# Patient Record
Sex: Female | Born: 1960 | Race: Black or African American | Hispanic: No | Marital: Married | Smoking: Never smoker
Health system: Southern US, Community
[De-identification: ages and names within clinical notes are randomized; demographics above are authoritative.]

## PROBLEM LIST (undated history)

## (undated) DIAGNOSIS — I10 Essential (primary) hypertension: Secondary | ICD-10-CM

## (undated) DIAGNOSIS — N189 Chronic kidney disease, unspecified: Secondary | ICD-10-CM

## (undated) HISTORY — PX: TUBAL LIGATION: SHX77

## (undated) HISTORY — PX: KIDNEY TRANSPLANT: SHX239

---

## 1998-05-03 ENCOUNTER — Other Ambulatory Visit: Admission: RE | Admit: 1998-05-03 | Discharge: 1998-05-03 | Payer: Self-pay | Admitting: Obstetrics and Gynecology

## 1999-10-15 ENCOUNTER — Encounter: Payer: Self-pay | Admitting: Vascular Surgery

## 1999-10-15 ENCOUNTER — Ambulatory Visit (HOSPITAL_COMMUNITY): Admission: RE | Admit: 1999-10-15 | Discharge: 1999-10-15 | Payer: Self-pay | Admitting: Vascular Surgery

## 2000-01-31 ENCOUNTER — Emergency Department (HOSPITAL_COMMUNITY): Admission: EM | Admit: 2000-01-31 | Discharge: 2000-01-31 | Payer: Self-pay | Admitting: Emergency Medicine

## 2000-01-31 ENCOUNTER — Encounter: Payer: Self-pay | Admitting: Emergency Medicine

## 2000-05-10 ENCOUNTER — Ambulatory Visit (HOSPITAL_COMMUNITY): Admission: RE | Admit: 2000-05-10 | Discharge: 2000-05-10 | Payer: Self-pay | Admitting: Vascular Surgery

## 2000-05-10 ENCOUNTER — Encounter: Payer: Self-pay | Admitting: Vascular Surgery

## 2000-07-05 ENCOUNTER — Ambulatory Visit: Admission: RE | Admit: 2000-07-05 | Discharge: 2000-07-05 | Payer: Self-pay | Admitting: Vascular Surgery

## 2000-07-05 ENCOUNTER — Encounter: Payer: Self-pay | Admitting: Vascular Surgery

## 2000-11-09 ENCOUNTER — Other Ambulatory Visit: Admission: RE | Admit: 2000-11-09 | Discharge: 2000-11-09 | Payer: Self-pay | Admitting: Obstetrics and Gynecology

## 2001-03-13 ENCOUNTER — Inpatient Hospital Stay (HOSPITAL_COMMUNITY): Admission: AD | Admit: 2001-03-13 | Discharge: 2001-03-13 | Payer: Self-pay | Admitting: Obstetrics & Gynecology

## 2001-06-09 ENCOUNTER — Inpatient Hospital Stay (HOSPITAL_COMMUNITY): Admission: AD | Admit: 2001-06-09 | Discharge: 2001-06-11 | Payer: Self-pay | Admitting: Obstetrics and Gynecology

## 2001-07-23 ENCOUNTER — Ambulatory Visit (HOSPITAL_COMMUNITY): Admission: RE | Admit: 2001-07-23 | Discharge: 2001-07-23 | Payer: Self-pay | Admitting: Obstetrics and Gynecology

## 2001-10-08 ENCOUNTER — Encounter: Payer: Self-pay | Admitting: Emergency Medicine

## 2001-10-08 ENCOUNTER — Emergency Department (HOSPITAL_COMMUNITY): Admission: EM | Admit: 2001-10-08 | Discharge: 2001-10-08 | Payer: Self-pay | Admitting: Emergency Medicine

## 2001-10-15 ENCOUNTER — Encounter: Payer: Self-pay | Admitting: Vascular Surgery

## 2001-10-15 ENCOUNTER — Ambulatory Visit (HOSPITAL_COMMUNITY): Admission: RE | Admit: 2001-10-15 | Discharge: 2001-10-15 | Payer: Self-pay | Admitting: Vascular Surgery

## 2001-12-10 ENCOUNTER — Ambulatory Visit (HOSPITAL_COMMUNITY): Admission: RE | Admit: 2001-12-10 | Discharge: 2001-12-10 | Payer: Self-pay | Admitting: Vascular Surgery

## 2002-03-23 ENCOUNTER — Inpatient Hospital Stay (HOSPITAL_COMMUNITY): Admission: RE | Admit: 2002-03-23 | Discharge: 2002-03-24 | Payer: Self-pay | Admitting: General Surgery

## 2003-05-13 ENCOUNTER — Emergency Department (HOSPITAL_COMMUNITY): Admission: EM | Admit: 2003-05-13 | Discharge: 2003-05-13 | Payer: Self-pay | Admitting: Emergency Medicine

## 2009-08-20 ENCOUNTER — Inpatient Hospital Stay (HOSPITAL_COMMUNITY): Admission: AD | Admit: 2009-08-20 | Discharge: 2009-08-20 | Payer: Self-pay | Admitting: Obstetrics & Gynecology

## 2010-12-17 LAB — URINALYSIS, ROUTINE W REFLEX MICROSCOPIC
Bilirubin Urine: NEGATIVE
Ketones, ur: NEGATIVE mg/dL
Nitrite: NEGATIVE
Protein, ur: NEGATIVE mg/dL
pH: 6.5 (ref 5.0–8.0)

## 2010-12-17 LAB — CBC
HCT: 36.5 % (ref 36.0–46.0)
MCV: 94.2 fL (ref 78.0–100.0)
Platelets: 218 10*3/uL (ref 150–400)
RBC: 3.88 MIL/uL (ref 3.87–5.11)
WBC: 7.1 10*3/uL (ref 4.0–10.5)

## 2010-12-17 LAB — WET PREP, GENITAL: Yeast Wet Prep HPF POC: NONE SEEN

## 2010-12-17 LAB — HCG, SERUM, QUALITATIVE: Preg, Serum: NEGATIVE

## 2010-12-17 LAB — POCT PREGNANCY, URINE: Preg Test, Ur: NEGATIVE

## 2011-01-31 NOTE — Op Note (Signed)
[place]. [facility]  Patient:    Lisa Merritt, Lisa Merritt Visit Number: [id number] MRN: [id number]          Service Type: DSU Location: [facility] [id number] Attending Physician:  Bennye Alm Dictated by:   Di Kindle Edilia Bo, M.D. Proc. 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  PROCEDURE:  Revision of left upper arm AV graft with replacement of venous half of graft with interposition 7 mm graft.  SURGEON:  Di Kindle. Edilia Bo, M.D.  ASSISTANT:  Loura Pardon, P.A.-C.  ANESTHESIA:  Local with sedation.  TECHNIQUE:  The patient was brought to the operating room, sedated by anesthesia. The left upper extremity was prepped and draped in the usual sterile fashion. After the skin was infiltrated with 1% lidocaine, an incision was made at the distal loop of the graft along the venous limb of the graft and also up on the higher aspect of the venous limb of the graft. Of note, the venous limb of the graft was along the ulnar aspect of the upper arm. These segments of graft were dissected free. A new segment of 7 mm graft was tunneled between the two incisions lateral to this large aneurysm. The patient was then heparinized with 5000 units of IV heparin. The graft was clamped proximally and distally and the graft was divided at both ends. A segment was excised from each end. The new segment of graft was then sewn end-to-end at both ends using continuous ______ suture. At the completion, there was an excellent thrill in the graft. The aneurysm was decompressed. Hemostasis was obtained in the wounds and the wounds were closed with a deep layer of 3-0 Vicryl and the skin closed with 4-0 Vicryl. A  sterile dressing was applied and the patient tolerated the procedure well and was transferred to the recovery room in satisfactory condition. All needle and sponge counts were correct. Dictated by:   Di Kindle Edilia Bo, M.D. Attending Physician:  Bennye Alm DD:  12/10/01 TD:  12/11/01 Job: [phone] [id number]

## 2011-01-31 NOTE — Op Note (Signed)
[place]. [facility]  Patient:    Lisa Merritt, Lisa Merritt Visit Number: [id number] MRN: [id number]          Service Type: DSU Location: [facility] [id number] Attending Physician:  Bennye Alm Dictated by:   Di Kindle Edilia Bo, M.D. Proc. Date: 10/15/01 Admit Date:  10/15/2001 Discharge Date: 10/15/2001   CC:         Di Kindle. Edilia Bo, M.D.   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  POSTOPERATIVE DIAGNOSIS:  Aneurysmal left upper arm loupe arteriovenous graft.  OPERATION:  Revision of venous half of upper arm loupe AV graft.  SURGEON:  Di Kindle. Edilia Bo, M.D.  ASSISTANTTollie Pizza. Collins, P.A.-C.  ANESTHESIA:  Local with sedation.  DESCRIPTION OF PROCEDURE:  The patient was taken to the operating room and sedated by anesthesia.  The entire left upper extremity was prepped and draped in the usual sterile fashion.  After the skin was infiltrated with 1% lidocaine, an oblique incision was made over the distal loupe of the graft and the graft here was dissected free.  A separate oblique incision was made over the proximal part of the graft, along the venous side of the graft, which was along the radial aspect of the upper arm.  The graft here was dissected free.  A new tunnel was created lateral to the old graft, around the large aneurysm using a curved tunneler. The patient was then heparinized.  The graft was then clamped at both ends and divided and the new segment of graft which was a 7 mm graft was sewn end-to-end using continuous CV6 PTFE suture.  At completion, there was a good thrill in the graft.  Hemostasis was obtained and the wounds were closed with a deep layer of 3-0 Vicryl and the skin closed with 4-0 Vicryl.  Of note, the patient had an aneurysm along the venous limb of the graft; however, as the patient did not want a catheter, we did not replace the  entire graft and therefore we left the venous half of the graft to be cannulated and to be revised at a future time once the new segment of graft can be cannulated.  Sterile dressing was applied.  The patient tolerated the procedure well.  The patient was transferred to the recovery room in satisfactory condition.  All need and sponge counts were correct. Dictated by:   Di Kindle Edilia Bo, M.D. Attending Physician:  Bennye Alm DD:  10/15/01 TD:  10/17/01 Job: [phone]

## 2011-01-31 NOTE — Op Note (Signed)
[place] [facility]  Patient:    Lisa Merritt, Lisa Merritt Visit Number: [id number] MRN: [id number]          Service Type: OBS Location: 910A [phone] Attending Physician:  Osborn Coho Proc. Date: 06/10/01 Admit Date:  06/09/2001 Discharge Date: 06/11/2001   CC:         CVTS Office   Operative Report  PREOPERATIVE DIAGNOSIS:  End-stage renal disease.  POSTOPERATIVE DIAGNOSIS:  End-stage renal disease.  OPERATION PERFORMED:  Thrombectomy revision of left upper extremity arteriovenous graft.  SURGEON:  Caralee Ates, M.D.  ASSISTANT:  Lissa Merlin, P.A.-C  ANESTHESIA:  MAC plus 1% lidocaine as local.  ESTIMATED BLOOD LOSS:  Twenty cc.  DRAINS:  None.  SPECIMENS:  None.  COMPLICATIONS:  None.  BRIEF HISTORY:  This patient is a 50 year old black female with end-stage renal disease who has been dialyzed through a left upper extremity AV graft. Most recently she was admitted to [facility] for delivery of her most recent child and subsequently developed thrombosis of her graft.  She was sent over to [facility] for thrombectomy and possible revision.  The risks, benefits and alternatives to the procedure were explained in detail and patient agreed to proceed.  DESCRIPTION OF PROCEDURE:  The patient was brought to the operating room and placed on the operating table in the supine position with the left arm extended on an arm board.  Following adequate IV sedation the left arm was draped and prepped circumferentially from the axilla to the hand.  A longitudinal incision was made on the left upper arm at the axilla through an existing scar.  The wound was deepened, using Bovie to control bleeding.  The venous limb of the graft was identified, dissected circumferentially removing dead scar tissue, and encircled the vessel loop proximally.  Next, the vein was dissected up in the axilla and encircled with a vessel loop; and, the patient was then  systemically heparinized.   Following adequate period of circulation time a transverse graftotomy was performed just proximal to the venous anastomosis.  Then a #4 Fogarty was distally along the venous outflow tract and withdrawn with return of a small amount of thrombus.  A large portion of the thrombus was present at the distal venous anastomosis.  The remainder of the axillary vein appeared to be widely patent and was of large caliber.  Next, the arterial limb of the graft was thrombectomized several times with a #4 Fogarty with return of excellent pulsatile inflow.  A clamp was placed on the arterial limb.  Next, the graft was transected and the distal portion of the graft including the anastomosis and the first centimeter of the axillary vein were resected.  Next, a short segment of 6 mm PTFE graft was selected and sewn in position in an end-to-end fashion with the axillary vein with a running 5-0 Prolene suture.  Following completion of the anastomosis a clamp was placed on the graft above the anastomosis.  The graft was then trimmed to the appropriate length and sewn into position in an end-to-end fashion with the proximal end graft with a running 5-0 Prolene suture.  Following completion of the anastomosis  the graft was flushed and the vein was backbled.  The anastomosis was then completed and clamps were released restoring flow.  At this point there was an excellent thrill in the graft and a 2+ palpable radial pulse.  Hemostasis was then achieved with along the incision line with the administration of 20  mg of protamine as well as direct pressure with Surgicel.  Once hemostasis was achieved the wound was copiously irrigated with warm sterile saline and closed in layers with 3-0 and 4-0 Vicryl sutures.  Sterile dry dressings were applied.  Patient was then awakened from anesthesia and transferred to the recovery room in stable condition.  The patient tolerated the procedure well.   No complications.  All needle and sponge counts were correct. Attending Physician:  Osborn Coho DD:  06/10/01 TD:  06/10/01 Job: [id number]

## 2011-01-31 NOTE — Op Note (Signed)
[place]. [facility]  Patient:    [name], [name] Visit Number: [id number] MRN: [id number]          Service Type: DSU Location: [id number] [phone] Attending Physician:  Henrene Dodge Dictated by:   Anselm Pancoast. Zachery Dakins, M.D. Proc. Date: 03/23/02 Admit Date:  03/23/2002 Discharge Date: 03/24/2002   CC:         [place] C. Lowell Guitar, M.D.  Dialysis Center   Operative Report  PREOPERATIVE DIAGNOSIS:  End-stage renal disease, desires continuous abdominoperitoneal dialysis.  OPERATION:  Placement of [place] double-cuff continuous abdominoperitoneal dialysis catheter.  ANESTHESIA:  General anesthesia.  SURGEON:  Anselm Pancoast. Zachery Dakins, M.D.  ASSISTANT:  Nurse.  HISTORY:  Lisa Merritt is a 51 year old female, who was referred to me by Drs. Lowell Guitar and associates and she wants to start peritoneal dialysis.  The patient is on hemodialysis and has had no previous abdominal surgery and appeared to be a good candidate for CAPD.  She is quite thin and I am planning to place the catheter in the right lower quadrant.  She was dialyzed yesterday and her potassium is normal this morning.  DESCRIPTION OF PROCEDURE:  The patient was taken to the operative suite.  She had been given a g of Kefzol.  Induction of general anesthesia, endotracheal tube, and positioned.  The abdomen was prepped with Betadine surgical scrub and solution, and draped in a sterile manner.  A small area to the right slightly below the umbilicus was made with sharp dissection down through the skin and subcutaneous tissue.  There was a little superficial bleeding that was coagulated.  The rectus fascia was opened.  The underlying rectus muscle was split in the direction of its fibers exposing the posterior rectus fascia and peritoneum.  This was picked up between two hemostats, a small opening made, then, three hemostats placed on the edges.  Two purse-string sutures of 2-0 Vicryl were  placed and then the [place] right catheter with a guidewire was inserted into the lower abdomen.  The sutures were tied so that the silastic ball was in the peritoneal cavity and the posterior cuff was lying flat on the rectus fascia.  The rectus muscle was then approximated with 3-0 chromic and the anterior fascia was closed with interrupted sutures of 2-0 Prolene so that the catheter goes obliquely through the thin abdominal wall. I then used the trocar to bring out the exit site slightly below and lateral to the insertion site and the external cuff was lying right at the dermis/subcutaneous junction.  The catheter flushes easily, returns clear. The subcutaneous tissue was closed with 3-0 chromic and then the skin was closed with interrupted 4-0 nylon.  Bacitracin ointment was placed around the catheter and then it was hooked up to the connector extension tubing and capped off.  The patient tolerated the procedure nicely.  I had anesthetized the muscles, subcutaneous tissue, and peritoneum with Marcaine with adrenalin to minimize the immediate postoperative pain.  The patient desires to be kept overnight and be hemodialyzed here in the morning and then should be able to start CAPD in approximately two weeks after completion of home ______ . Dictated by:   Anselm Pancoast. Zachery Dakins, M.D. Attending Physician:  Henrene Dodge DD:  03/23/02 TD:  03/25/02 Job: [id number]

## 2011-01-31 NOTE — Op Note (Signed)
[facility]  Patient:    NORINA, COWPER Visit Number: [id number] MRN: [id number]          Service Type: DSU Location: [facility] Attending Physician:  Osborn Coho Dictated by:   Janeece Riggers Dareen Piano, M.D. Proc. Date: 07/23/01 Admit Date:  07/23/2001 Discharge Date: 07/23/2001                             Operative Report  PREOPERATIVE DIAGNOSIS:       Patient desires permanent sterilization.  POSTOPERATIVE DIAGNOSIS:      Patient desires permanent sterilization.  OPERATION:                    Laparoscopic bilateral tubal ligation, cauterization.  SURGEON:                      Mark E. Dareen Piano, M.D.  ANESTHESIA:                   General.  ANTIBIOTICS:                  Ancef 1 gm.  DRAINS:                       Red rubber catheter to bladder.  ESTIMATED BLOOD LOSS:         Minimal.  COMPLICATIONS:                None.  SPECIMENS:                    None.  DESCRIPTION OF PROCEDURE:     The patient was taken to the operating room where she was placed in the dorsal supine position.  A general anesthetic was administered without complications.  She was then placed in the dorsal lithotomy position and prepped with Hibiclens.  Her bladder was drained with a red rubber catheter.  A single tooth tenaculum was applied to the anterior cervical lip.  A cannula was then placed into the cervical os.  The patient was then draped in the usual fashion for this procedure.  The umbilicus was then injected with a 0.25% Marcaine.  A vertical skin incision was then made.  The Verres needle was placed in the perineal cavity and three liters of carbon dioxide was insufflated.  The 11 mm trocar was then placed into the perineal cavity.  The scope was then placed.  On examination, the patient had a normal-appearing fallopian tubes and ovaries bilaterally.  There was no evidence of endometriosis or adhesions.  The uterus was consistent with five weeks  postpartum and measured approximately 10 weeks in size.  At this point, the right fallopian tube was grasped with the Klepinger forceps and a 2 cm area isthmic portion cauterized.  A similar procedure was performed on the opposite side.  The fallopian tube was then transected in the midline of the cauterized area.  The patient tolerated the procedure well.  At this point, the instruments were removed and pneumoperitoneum released.  The fascia was closed with interrupted 0 Vicryl suture and the skin closed with interrupted 4-0 Vicryl suture.  The patient tolerated the procedure well.  She was taken to the recovery room in stable condition.  Instrument and lap counts were correct x 2.  DISCHARGE INSTRUCTIONS:       The patient will be discharged to home and  instructed to follow up in the office in four weeks.  DISCHARGE MEDICATIONS:        She was sent home with Tylox to take p.r.n. Dictated by:   Janeece Riggers Dareen Piano, M.D. Attending Physician:  Osborn Coho DD:  07/23/01 TD:  07/25/01 Job: [id number]

## 2011-01-31 NOTE — Discharge Summary (Signed)
[facility]  Patient:    Lisa Merritt, Lisa Merritt Visit Number: [id number] MRN: [id number]          Service Type: OBS Location: 910A 9146 01 Attending Physician:  Osborn Coho Dictated by:   Gerrit Friends. Aldona Bar, M.D. Admit Date:  06/09/2001 Discharge Date: 06/11/2001                             Discharge Summary  DISCHARGE DIAGNOSES:          1. Term pregnancy, delivered 5-pound, 13-ounce                                  female infant, Apgars of 8 and 9.                               2. Blood type O positive.                               3. 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  PROCEDURES:                   1. Induction of labor.                               2. Normal spontaneous delivery over intact                                  perineum.                               3. Thrombectomy and revision of graft for                                  dialysis, carried out at [facility]                                  by the vascular service on June 10, 2001.  HISTORY OF PRESENT ILLNESS:   This 50 year old patient was admitted at 37+ weeks gestation for induction.  She is a gravida 3, para 2.  Her pregnancy has been complicated by renal failure, for which she has received dialysis throughout her pregnancy.  HOSPITAL COURSE:              She was admitted and underwent induction of labor with normal spontaneous delivery  subsequently over an intact perineum of a viable female infant.  She had a routine postpartum course except she had a revision of her dialysis line on June 10, 2001.  On the morning of June 11, 2001, she was afebrile, ambulating well, having normal bowel and bladder function, was afebrile, and was ready for discharge.  Accordingly, she was given all appropriate  instructions.  Discharge hemoglobin was 11.3, with a white count of 9300, a platelet count of 229,000.  Her creatinine was 5.6 with a BUN of 34 (relatively stable).  She was given all appropriate instructions on the morning of discharge and understood all instructions well.  DISCHARGE MEDICATIONS:        She will use Tylenol sparingly as needed for discomfort.  She will finish up her vitamins.  FOLLOW-UP:                    She will return to the office for follow-up in approximately four weeks time. Dictated by:   Gerrit Friends. Aldona Bar, M.D. Attending Physician:  Osborn Coho DD:  06/11/01 TD:  06/11/01 Job: [phone] [id number]

## 2011-01-31 NOTE — Op Note (Signed)
[facility]  Patient:    Lisa Merritt, Lisa Merritt                     MRN: [id number] Proc. Date: 07/05/00 Adm. Date:  [id number] Disc. Date: [id number] Attending:  Bennye Alm                           Operative Report  PREOPERATIVE DIAGNOSIS:  Chronic renal failure.  POSTOPERATIVE DIAGNOSIS:  Chronic renal failure.  PROCEDURE: 1. Thrombectomy of left upper arm arteriovenous graft. 2. Insertion of new segment of graft to more proximal axillary vein. 3. Intraoperative arteriogram.  SURGEON:  Di Kindle. Edilia Bo, M.D.  ASSISTANT:  Nurse.  ANESTHESIA:  Local with sedation.  DESCRIPTION OF PROCEDURE:  The patient was taken to the operating room and sedated by anesthesia.  The entire left upper extremity was prepped and draped in the usual sterile fashion.  After the skin was anesthetized with 1% lidocaine, incision over the arterial and venous anastomosis was anesthetized and the venous limb of the graft was dissected free through dense scar tissue. This was an upper arm loop graft, and I was able to carefully separate the vein from the artery.  The vein proximal to the anastomosis was dissected free where it was soft.  The patient was then heparinized and then the graft was divided.  Graft thrombectomy was achieved using a #4 Fogarty catheter.  The arterial plug was retrieved and good inflow established.  The graft was flushed with heparinized saline and clamped.  There were no significant irregularities in pulling the catheter through the body of the graft.  Next, the venous thrombectomy was performed, excising the old venous anastomosis, as there was intimal hyperplasia present here.  The more proximal vein was good-sized.  An interposition 7 mm PTFE graft was then brought to the field and sewn end-to-end to the vein and then cut to the appropriate length and sewn end-to-end to the old graft using continuous 6-0 Prolene suture.   At completion, there was a good thrill in the graft.  Intraoperative fistulogram was obtained, which showed no technical problems with the exception of some minimal irregularity throughout the graft.  The arterial anastomosis was widely patent.  Next, hemostasis was obtained in the wound, and the wound was closed with a deep layer of 3-0 Vicryl and the skin closed with 4-0 Vicryl.  A sterile dressing was applied.  The patient tolerated the procedure well and was transferred to the recovery room in satisfactory condition.  All needle and sponge counts were correct. DD:  07/05/00 TD:  07/06/00 Job: [id number]

## 2011-01-31 NOTE — Op Note (Signed)
[facility]  Patient:    Lisa Merritt, Lisa Merritt                     MRN: [id number] Proc. Date: 05/10/00 Adm. Date:  [id number] Attending:  Bennye Alm                           Operative Report  PREOPERATIVE DIAGNOSES: 1. Chronic renal failure. 2. Clotted left upper arm AV graft.  POSTOPERATIVE DIAGNOSES: 1. Chronic renal failure. 2. Clotted left upper arm AV graft.  PROCEDURE: 1. Thrombectomy of left upper arm AV graft. 2. Insertion of new segment of graft to the more proximal axillary vein. 3. Intraoperative fistulogram.  SURGEON:  Dr. Edilia Bo.  ASSISTANT:  Nurse.  ANESTHESIA:  Local with sedation.  TECHNIQUE:  The patient was taken to the operating room and sedated by anesthesia. The left upper extremity was prepped and draped in the usual sterile fashion. After the skin was infiltrated with 1% lidocaine, the incision over the arterial and venous anastomoses were opened and both were dissected free. I decided to jump higher on the axillary vein and this was dissected free further proximally where it was soft and free of disease. The graft was then divided, graft thrombectomy was achieved using a #4 Fogarty catheter after the patient was heparinized. The arterial plug was retrieved and excellent inflow established. The graft was flushed with heparinized saline and clamped. A new segment of graft was sewn end-to-end to the high axillary vein using continuous 6-0 Prolene suture. It was then cut to the appropriate length and anastomosed end-to-end to the old graft using continuous 6-0 Prolene. At the completion, there was an excellent thrill in the graft. Intraoperative fistulogram was obtained which showed no technical problems. Of note, there is room for further revisions of this graft on the venous side. Hemostasis was obtained in the wound and the wound was closed with a deep layer of 3-0 Vicryl and the skin closed with 4-0 Vicryl.  A sterile dressing was applied. The patient tolerated the procedure well and was transferred to the recovery room in satisfactory condition. All needle and sponge counts were correct. DD:  05/10/00 TD:  05/11/00 Job: [id number]

## 2011-04-09 IMAGING — US US TRANSVAGINAL NON-OB
1 series · 14 of 25 positions shown · non-contrast
Comparison: None available.

CLINICAL DATA: Stomach pain.  Abdominal pain.  Urine pregnancy test
negative.

TRANSABDOMINAL AND TRANSVAGINAL ULTRASOUND OF PELVIS
TECHNIQUE: Both transabdominal and transvaginal ultrasound
examinations of the pelvis were performed including evaluation of
the uterus, ovaries, adnexal regions, and pelvic cul-de-sac.

[Series 1: us transvaginal non-ob · 0.24mm/px · 14 of 50 slices shown]
[im 1/50]
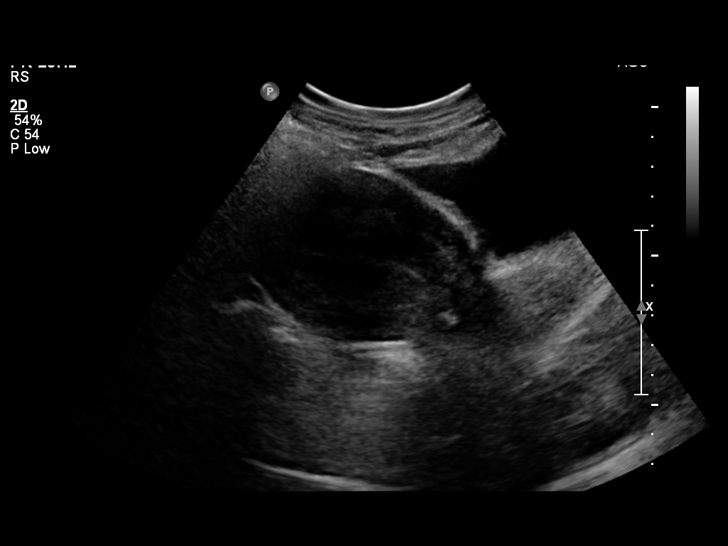
[im 5/50]
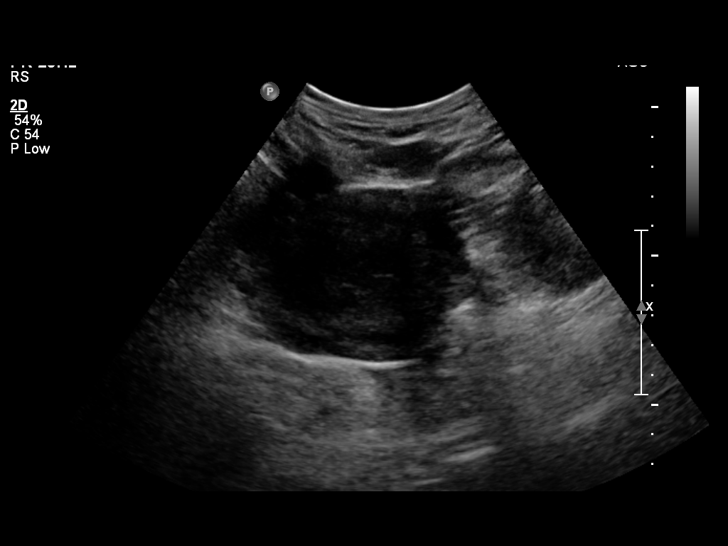
[im 9/50]
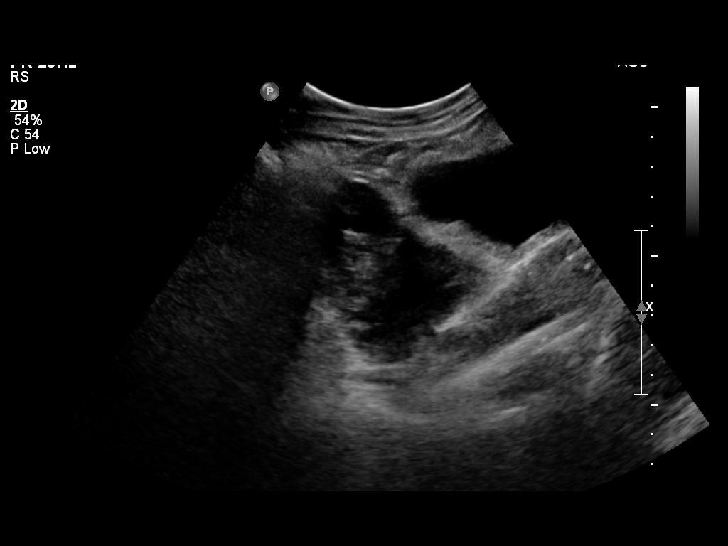
[im 13/50]
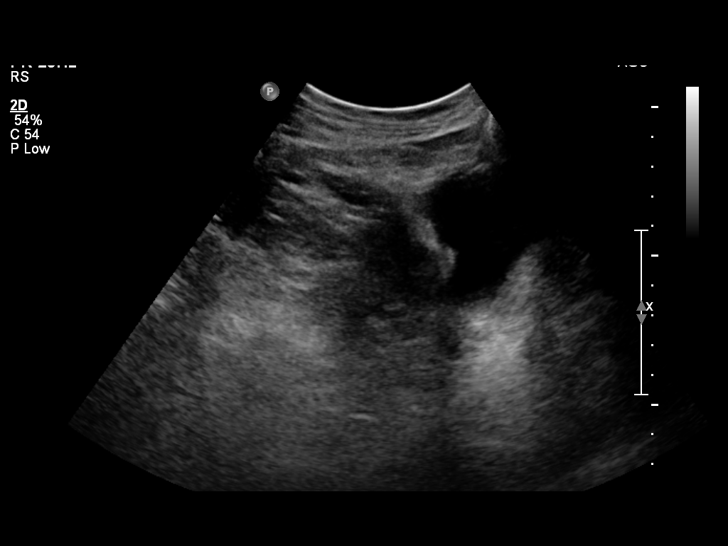
[im 17/50]
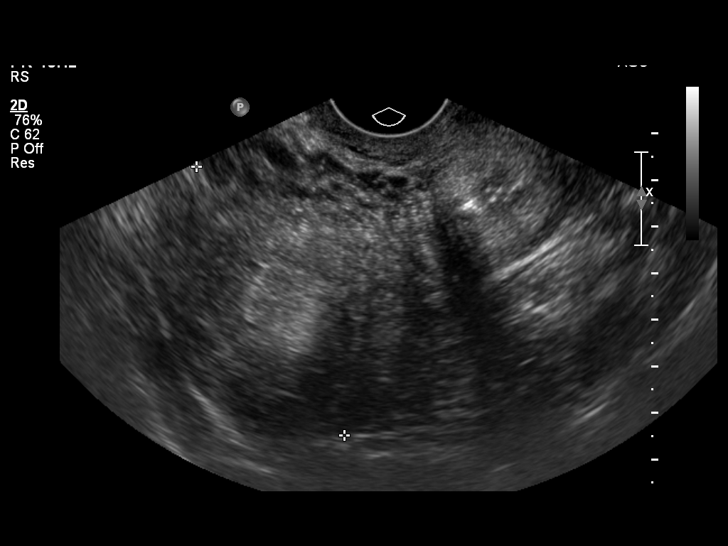
[im 19/50]
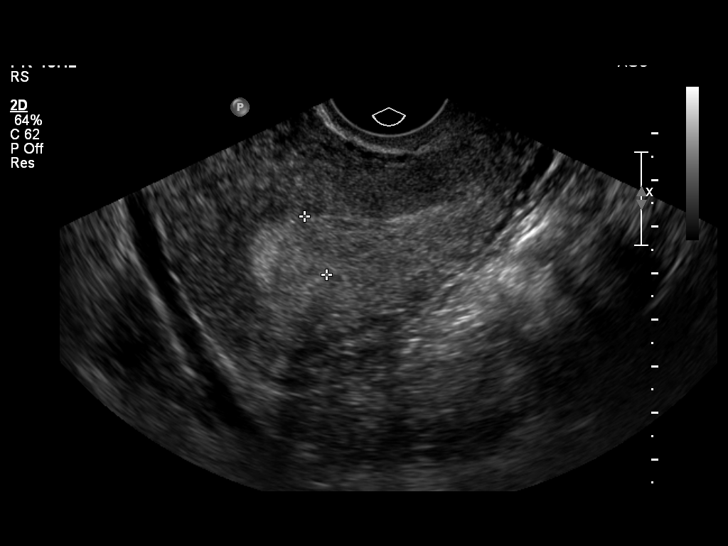
[im 23/50]
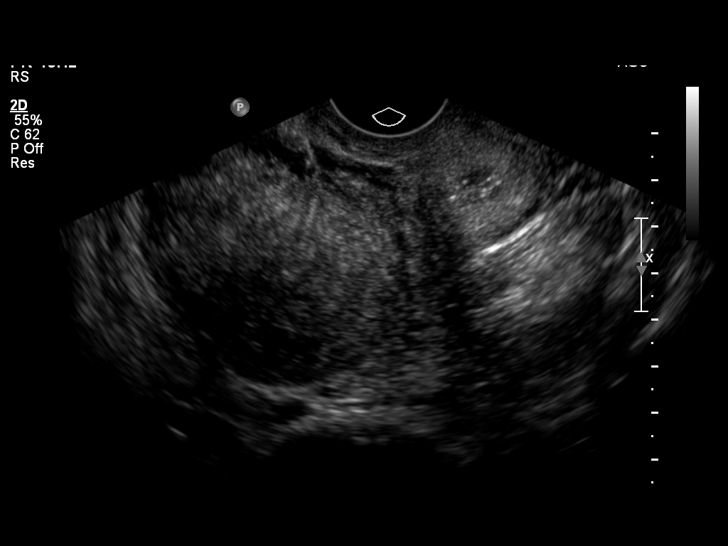
[im 27/50]
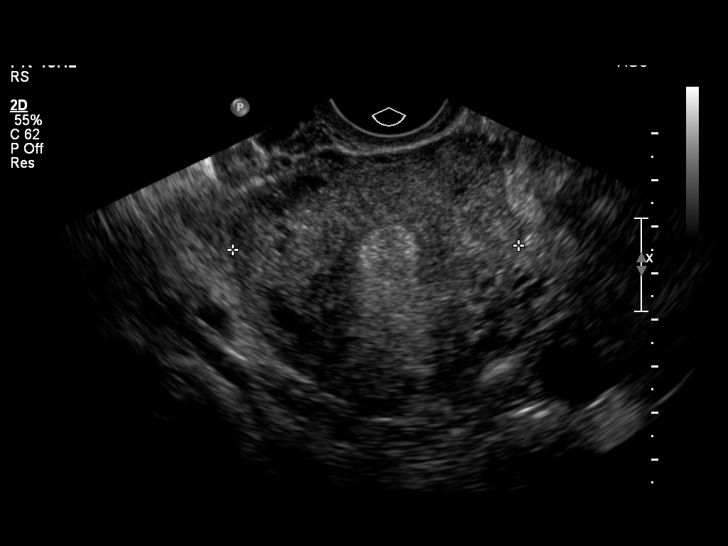
[im 31/50]
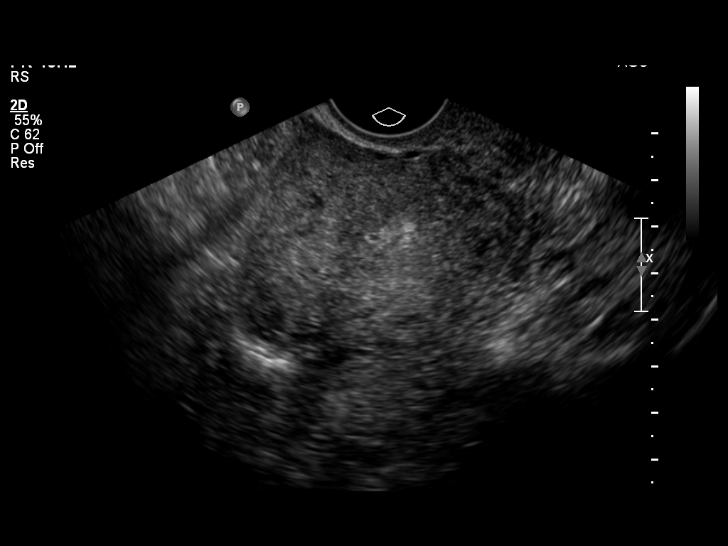
[im 33/50]
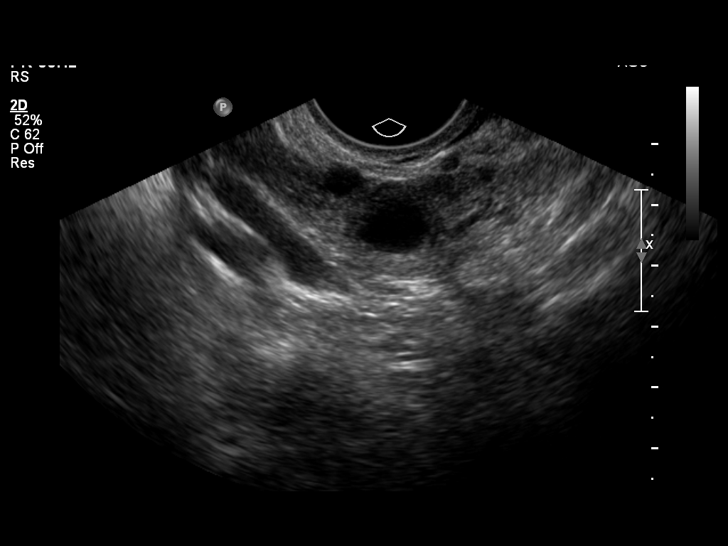
[im 37/50]
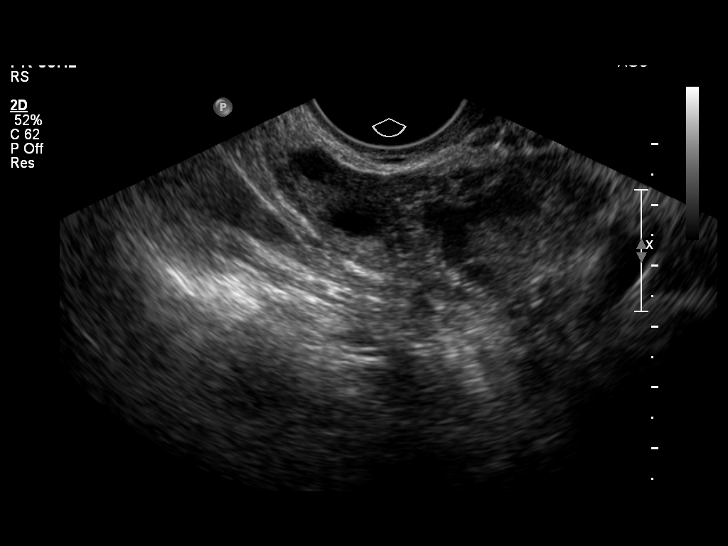
[im 41/50]
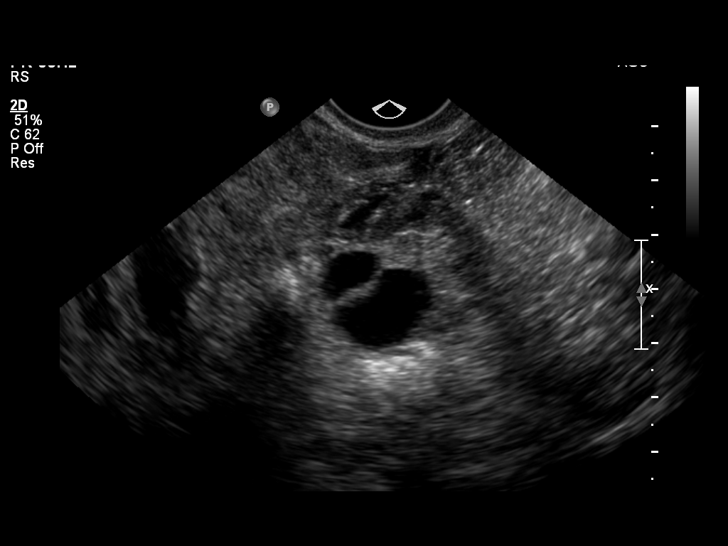
[im 45/50]
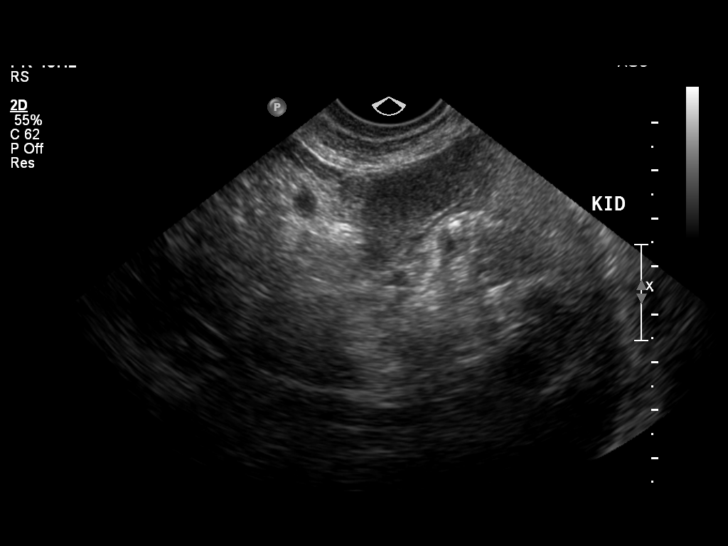
[im 50/50]
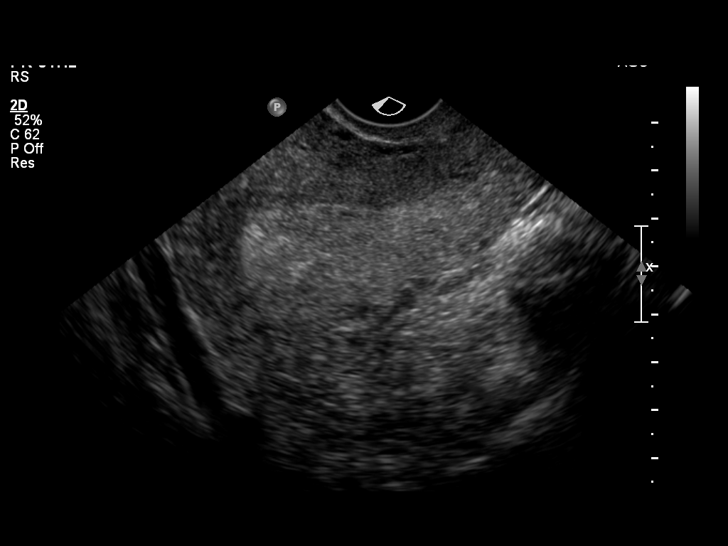

[14 of 25 positions shown; findings below may reference images not displayed]

FINDINGS: Uterus measures 10.6 x 6.6 x 6.1 cm.

Endometrium   Endometrial thickness is 18 mm.

Right Ovary 2.3 x 1.6 x 1.2 cm.  Physiologic appearance.

Left Ovary 2.7 x 2.2 x 1.9 cm.  Physiologic appearance.

Other Findings:  Kidney transplant in the left iliac fossa.  No
free fluid.
IMPRESSION: Thickened endometrium, even for a premenopausal woman.  Consider
repeat examination on day seven of cycle.  Otherwise no significant
abnormality.

## 2013-07-21 ENCOUNTER — Inpatient Hospital Stay (HOSPITAL_COMMUNITY)
Admission: AD | Admit: 2013-07-21 | Discharge: 2013-07-21 | Disposition: A | Discharge status: Home / Self Care | Payer: Medicare Other | Source: Ambulatory Visit | Attending: Obstetrics and Gynecology | Admitting: Obstetrics and Gynecology

## 2013-07-21 ENCOUNTER — Encounter (HOSPITAL_COMMUNITY): Payer: Self-pay | Admitting: *Deleted

## 2013-07-21 DIAGNOSIS — R109 Unspecified abdominal pain: Secondary | ICD-10-CM | POA: Insufficient documentation

## 2013-07-21 DIAGNOSIS — N949 Unspecified condition associated with female genital organs and menstrual cycle: Secondary | ICD-10-CM | POA: Insufficient documentation

## 2013-07-21 DIAGNOSIS — R102 Pelvic and perineal pain: Secondary | ICD-10-CM

## 2013-07-21 DIAGNOSIS — Z940 Kidney transplant status: Secondary | ICD-10-CM | POA: Insufficient documentation

## 2013-07-21 HISTORY — DX: Essential (primary) hypertension: I10

## 2013-07-21 HISTORY — DX: Chronic kidney disease, unspecified: N18.9

## 2013-07-21 LAB — URINALYSIS, ROUTINE W REFLEX MICROSCOPIC
Hgb urine dipstick: NEGATIVE
Nitrite: NEGATIVE
Specific Gravity, Urine: 1.02 (ref 1.005–1.030)
Urobilinogen, UA: 0.2 mg/dL (ref 0.0–1.0)
pH: 6 (ref 5.0–8.0)

## 2013-07-21 LAB — URINE MICROSCOPIC-ADD ON

## 2013-07-21 LAB — WET PREP, GENITAL
Trich, Wet Prep: NONE SEEN
Yeast Wet Prep HPF POC: NONE SEEN

## 2013-07-21 LAB — POCT PREGNANCY, URINE: Preg Test, Ur: NEGATIVE

## 2013-07-21 MED ORDER — OXYCODONE-ACETAMINOPHEN 5-325 MG PO TABS
2.0000 | ORAL_TABLET | Freq: Once | ORAL | Status: AC
  Administered 2013-07-21: 2 via ORAL
  Filled 2013-07-21: qty 2

## 2013-07-21 MED ORDER — OXYCODONE-ACETAMINOPHEN 5-325 MG PO TABS: 1.0000 | ORAL_TABLET | ORAL | Status: DC | PRN

## 2013-07-21 NOTE — MAU Provider Note (Signed)
History     CSN: [id number]  Arrival date and time: 07/21/13 9528   First Provider Initiated Contact with Patient 07/21/13 [phone]      Chief Complaint  Patient presents with  . Abdominal Cramping   HPI Ms. Lisa Merritt is a 52 y.o. G3P3 who presents to MAU today with complaint of lower abdominal pain. The patient states that she is followed by a GYN in [place] at the [facility]. She has been seeing them about Peri-Menopausal symptoms of irregular periods. She was on Provera to induce periods x months. She states that the medication did not induce a period in September or October so her GYN started her on Lutera (OCP) 1 week ago. The patient states LMP of 04/17/13. She states that she has had lower abdominal cramping x 3 weeks and worse x 1 week since starting the OCPs. She states that her pain is rated at 10/10 now. She has not taken any pain medication. She states that she cannot take ibuprofen or ASA because of a kidney transplant. She denies fever, UTI symptoms, GI symtpoms or vaginal discharge.    OB History   Grav Para Term Preterm Abortions TAB SAB Ect Mult Living   3 3        3       Past Medical History  Diagnosis Date  . Chronic kidney disease     FSGS, kidney transplant  . Hypertension     Past Surgical History  Procedure Laterality Date  . Kidney transplant    . Tubal ligation      History reviewed. No pertinent family history.  History  Substance Use Topics  . Smoking status: Never Smoker   . Smokeless tobacco: Not on file  . Alcohol Use: No    Allergies: Allergies not on file  No prescriptions prior to admission    Review of Systems  Constitutional: Negative for fever.  Gastrointestinal: Positive for abdominal pain. Negative for nausea, vomiting, diarrhea and constipation.  Genitourinary: Negative for dysuria, urgency and frequency.       Neg - vaginal bleeding, discharge   Physical Exam   Blood pressure 121/66, pulse 97, temperature 98.8 F (37.1 C),  resp. rate 18, height 5\' 7"  (1.702 m), weight 162 lb 3.2 oz (73.573 kg), last menstrual period 04/17/2013.  Physical Exam  Constitutional: She is oriented to person, place, and time. She appears well-developed and well-nourished. No distress.  HENT:  Head: Normocephalic and atraumatic.  Cardiovascular: Normal rate, regular rhythm and normal heart sounds.   Respiratory: Effort normal and breath sounds normal. No respiratory distress.  GI: Soft. Bowel sounds are normal. She exhibits no distension and no mass. There is tenderness (mild tenderness to palpation of the lower abdomen at midline). There is no rebound and no guarding.  Genitourinary: Uterus is not enlarged and not tender. Cervix exhibits no motion tenderness, no discharge and no friability. Right adnexum displays no mass and no tenderness. Left adnexum displays no mass and no tenderness. No bleeding around the vagina. Vaginal discharge (scant thin, white discharge noted) found.  Neurological: She is alert and oriented to person, place, and time.  Skin: Skin is warm and dry. No erythema.  Psychiatric: She has a normal mood and affect.   Results for orders placed during the hospital encounter of 07/21/13 (from the past 24 hour(s))  URINALYSIS, ROUTINE W REFLEX MICROSCOPIC     Status: Abnormal   Collection Time    07/21/13  9:05 AM  Result Value Range   Color, Urine YELLOW  YELLOW   APPearance CLEAR  CLEAR   Specific Gravity, Urine 1.020  1.005 - 1.030   pH 6.0  5.0 - 8.0   Glucose, UA NEGATIVE  NEGATIVE mg/dL   Hgb urine dipstick NEGATIVE  NEGATIVE   Bilirubin Urine NEGATIVE  NEGATIVE   Ketones, ur NEGATIVE  NEGATIVE mg/dL   Protein, ur NEGATIVE  NEGATIVE mg/dL   Urobilinogen, UA 0.2  0.0 - 1.0 mg/dL   Nitrite NEGATIVE  NEGATIVE   Leukocytes, UA SMALL (*) NEGATIVE  URINE MICROSCOPIC-ADD ON     Status: Abnormal   Collection Time    07/21/13  9:05 AM      Result Value Range   Squamous Epithelial / LPF RARE  RARE   WBC, UA  11-20  <3 WBC/hpf   Bacteria, UA FEW (*) RARE  POCT PREGNANCY, URINE     Status: None   Collection Time    07/21/13 10:05 AM      Result Value Range   Preg Test, Ur NEGATIVE  NEGATIVE  WET PREP, GENITAL     Status: Abnormal   Collection Time    07/21/13 10:10 AM      Result Value Range   Yeast Wet Prep HPF POC NONE SEEN  NONE SEEN   Trich, Wet Prep NONE SEEN  NONE SEEN   Clue Cells Wet Prep HPF POC NONE SEEN  NONE SEEN   WBC, Wet Prep HPF POC MODERATE (*) NONE SEEN     MAU Course  Procedures None  MDM UA, Wet prep, GC/Chlamydia today Percocet given in MAU - patient reports significant improvement in pain Discussed patient with Dr. Jolayne Panther. Ok for discharge with pain medication and follow-up with GYN in [place].  Assessment and Plan  A: Pelvic pain in female Peri-menopause  P: Discharge home Rx for Percocet given to patient Patient advised to follow-up with GYN in [place] if the [place] does not work as expected Patient may return to MAU as needed or if her condition were to change or worsen   Freddi Starr, PA-C  07/21/2013, 9:36 AM

## 2013-07-21 NOTE — MAU Note (Signed)
Pt is premenopausal had  Been on provera then switched latera . Has not had a period in 2 months. Pt reports she started having cramping about 3 seeks ago but no period.

## 2013-07-22 LAB — URINE CULTURE
Colony Count: NO GROWTH
Culture: NO GROWTH

## 2013-07-22 NOTE — MAU Provider Note (Signed)
Attestation of Attending Supervision of Advanced Practitioner (CNM/NP): Evaluation and management procedures were performed by the Advanced Practitioner under my supervision and collaboration.  I have reviewed the Advanced Practitioner's note and chart, and I agree with the management and plan.  [name] 07/22/2013 8:04 AM

## 2013-11-08 ENCOUNTER — Emergency Department (HOSPITAL_COMMUNITY)
Admission: EM | Admit: 2013-11-08 | Discharge: 2013-11-08 | Disposition: A | Discharge status: Home / Self Care | Payer: Medicare Other | Attending: Emergency Medicine | Admitting: Emergency Medicine

## 2013-11-08 ENCOUNTER — Encounter (HOSPITAL_COMMUNITY): Payer: Self-pay | Admitting: Emergency Medicine

## 2013-11-08 DIAGNOSIS — Z79899 Other long term (current) drug therapy: Secondary | ICD-10-CM | POA: Insufficient documentation

## 2013-11-08 DIAGNOSIS — IMO0002 Reserved for concepts with insufficient information to code with codable children: Secondary | ICD-10-CM | POA: Insufficient documentation

## 2013-11-08 DIAGNOSIS — R109 Unspecified abdominal pain: Secondary | ICD-10-CM | POA: Insufficient documentation

## 2013-11-08 DIAGNOSIS — N189 Chronic kidney disease, unspecified: Secondary | ICD-10-CM | POA: Insufficient documentation

## 2013-11-08 DIAGNOSIS — I10 Essential (primary) hypertension: Secondary | ICD-10-CM | POA: Insufficient documentation

## 2013-11-08 DIAGNOSIS — R197 Diarrhea, unspecified: Secondary | ICD-10-CM | POA: Insufficient documentation

## 2013-11-08 LAB — URINE MICROSCOPIC-ADD ON

## 2013-11-08 LAB — I-STAT CG4 LACTIC ACID, ED: Lactic Acid, Venous: 1.11 mmol/L (ref 0.5–2.2)

## 2013-11-08 LAB — COMPREHENSIVE METABOLIC PANEL
ALBUMIN: 4.3 g/dL (ref 3.5–5.2)
ALK PHOS: 82 U/L (ref 39–117)
ALT: 15 U/L (ref 0–35)
AST: 21 U/L (ref 0–37)
BILIRUBIN TOTAL: 0.3 mg/dL (ref 0.3–1.2)
BUN: 13 mg/dL (ref 6–23)
CHLORIDE: 102 meq/L (ref 96–112)
CO2: 27 mEq/L (ref 19–32)
Calcium: 10 mg/dL (ref 8.4–10.5)
Creatinine, Ser: 0.95 mg/dL (ref 0.50–1.10)
GFR calc non Af Amer: 69 mL/min — ABNORMAL LOW (ref >90)
GFR, EST AFRICAN AMERICAN: 80 mL/min — AB (ref >90)
GLUCOSE: 99 mg/dL (ref 70–99)
POTASSIUM: 3.8 meq/L (ref 3.7–5.3)
SODIUM: 142 meq/L (ref 137–147)
Total Protein: 7.7 g/dL (ref 6.0–8.3)

## 2013-11-08 LAB — URINALYSIS, ROUTINE W REFLEX MICROSCOPIC
BILIRUBIN URINE: NEGATIVE
Glucose, UA: NEGATIVE mg/dL
HGB URINE DIPSTICK: NEGATIVE
KETONES UR: NEGATIVE mg/dL
NITRITE: NEGATIVE
PH: 7.5 (ref 5.0–8.0)
Protein, ur: NEGATIVE mg/dL
SPECIFIC GRAVITY, URINE: 1.009 (ref 1.005–1.030)
Urobilinogen, UA: 0.2 mg/dL (ref 0.0–1.0)

## 2013-11-08 LAB — CBC WITH DIFFERENTIAL/PLATELET
Basophils Absolute: 0 10*3/uL (ref 0.0–0.1)
Basophils Relative: 0 % (ref 0–1)
Eosinophils Absolute: 0.1 10*3/uL (ref 0.0–0.7)
Eosinophils Relative: 1 % (ref 0–5)
HCT: 43.3 % (ref 36.0–46.0)
HEMOGLOBIN: 14.7 g/dL (ref 12.0–15.0)
LYMPHS ABS: 1.6 10*3/uL (ref 0.7–4.0)
Lymphocytes Relative: 16 % (ref 12–46)
MCH: 31.6 pg (ref 26.0–34.0)
MCHC: 33.9 g/dL (ref 30.0–36.0)
MCV: 93.1 fL (ref 78.0–100.0)
MONOS PCT: 6 % (ref 3–12)
Monocytes Absolute: 0.6 10*3/uL (ref 0.1–1.0)
NEUTROS ABS: 7.8 10*3/uL — AB (ref 1.7–7.7)
NEUTROS PCT: 77 % (ref 43–77)
PLATELETS: 235 10*3/uL (ref 150–400)
RBC: 4.65 MIL/uL (ref 3.87–5.11)
RDW: 13.6 % (ref 11.5–15.5)
WBC: 10.1 10*3/uL (ref 4.0–10.5)

## 2013-11-08 LAB — LIPASE, BLOOD: LIPASE: 38 U/L (ref 11–59)

## 2013-11-08 MED ORDER — TRAMADOL HCL 50 MG PO TABS: 50.0000 mg | ORAL_TABLET | Freq: Four times a day (QID) | ORAL | Status: DC | PRN

## 2013-11-08 MED ORDER — SODIUM CHLORIDE 0.9 % IV BOLUS (SEPSIS)
1000.0000 mL | Freq: Once | INTRAVENOUS | Status: AC
  Administered 2013-11-08: 1000 mL via INTRAVENOUS

## 2013-11-08 MED ORDER — ONDANSETRON HCL 4 MG/2ML IJ SOLN
4.0000 mg | Freq: Once | INTRAMUSCULAR | Status: AC
  Administered 2013-11-08: 4 mg via INTRAVENOUS
  Filled 2013-11-08: qty 2

## 2013-11-08 MED ORDER — FENTANYL CITRATE 0.05 MG/ML IJ SOLN
50.0000 ug | Freq: Once | INTRAMUSCULAR | Status: AC
  Administered 2013-11-08: 50 ug via INTRAVENOUS
  Filled 2013-11-08: qty 2

## 2013-11-08 MED ORDER — MORPHINE SULFATE 4 MG/ML IJ SOLN
4.0000 mg | Freq: Once | INTRAMUSCULAR | Status: AC
  Administered 2013-11-08: 4 mg via INTRAVENOUS
  Filled 2013-11-08: qty 1

## 2013-11-08 NOTE — ED Provider Notes (Signed)
CSN: [id number]     Arrival date & time 11/08/13  4098 History   None    Chief Complaint  Patient presents with  . Abdominal Pain     (Consider location/radiation/quality/duration/timing/severity/associated sxs/prior Treatment) HPI Patient is a 53 yo woman who is s/p kidney transplant. She presents with complaints of burning lower abdominal pain which has been intermittent for the past couple of hours. She notes having had 3 episodes of nonbloody diarrhea. No nausea, vomiting, fever or GU sx. Her transplanted kidney is in the LLQ.   Patient says she had similar sx about 1 week ago. She denies history of similar sx. NO fever. Pain is mild to moderately severe.   Past Medical History  Diagnosis Date  . Chronic kidney disease     FSGS, kidney transplant  . Hypertension    Past Surgical History  Procedure Laterality Date  . Kidney transplant    . Tubal ligation     No family history on file. History  Substance Use Topics  . Smoking status: Never Smoker   . Smokeless tobacco: Not on file  . Alcohol Use: No   OB History   Grav Para Term Preterm Abortions TAB SAB Ect Mult Living   3 3        3      Review of Systems Ten point review of symptoms performed and is negative with the exception of symptoms noted above.     Allergies  Darvon  Home Medications   Current Outpatient Rx  Name  Route  Sig  Dispense  Refill  . atorvastatin (LIPITOR) 10 MG tablet   Oral   Take 10 mg by mouth daily.         Marland Kitchen LISINOPRIL PO   Oral   Take 1 tablet by mouth daily.         Marland Kitchen NIFEDIPINE PO   Oral   Take 2 tablets by mouth 2 (two) times daily.         Marland Kitchen oxyCODONE-acetaminophen (PERCOCET/ROXICET) 5-325 MG per tablet   Oral   Take 1-2 tablets by mouth every 4 (four) hours as needed for severe pain.   20 tablet   0   . prednisoLONE 5 MG TABS tablet   Oral   Take 5 mg by mouth daily.         . Tacrolimus (PROGRAF PO)   Oral   Take 2 tablets by mouth 2 (two) times  daily.          BP 126/70  Pulse 76  Temp(Src) 98.8 F (37.1 C) (Oral)  Resp 16  Ht 5\' 6"  (1.676 m)  Wt 160 lb (72.576 kg)  BMI 25.84 kg/m2  SpO2 100% Physical Exam Gen: well developed and well nourished appearing, mildly uncomfortable appearing Head: NCAT Eyes: PERL, EOMI Nose: no epistaixis or rhinorrhea Mouth/throat: mucosa is moist and pink Neck: supple, no stridor Lungs: CTA B, no wheezing, rhonchi or rales CV: RRR, no murmur, extremities appear well perfused.  Abd: soft, very mildly tender over the lower abdomen,obese,  nondistended Back: no ttp, no cva ttp Skin: warm and dry Ext: normal to inspection, no dependent edema Neuro: CN ii-xii grossly intact, no focal deficits Psyche;mildly anxious affect,  calm and cooperative.   ED Course  Procedures (including critical care time) Labs Review Results for orders placed during the hospital encounter of 11/08/13 (from the past 24 hour(s))  CBC WITH DIFFERENTIAL     Status: Abnormal   Collection Time  11/08/13  4:47 AM      Result Value Ref Range   WBC 10.1  4.0 - 10.5 K/uL   RBC 4.65  3.87 - 5.11 MIL/uL   Hemoglobin 14.7  12.0 - 15.0 g/dL   HCT 16.1  09.6 - 04.5 %   MCV 93.1  78.0 - 100.0 fL   MCH 31.6  26.0 - 34.0 pg   MCHC 33.9  30.0 - 36.0 g/dL   RDW 40.9  81.1 - 91.4 %   Platelets 235  150 - 400 K/uL   Neutrophils Relative % 77  43 - 77 %   Neutro Abs 7.8 (*) 1.7 - 7.7 K/uL   Lymphocytes Relative 16  12 - 46 %   Lymphs Abs 1.6  0.7 - 4.0 K/uL   Monocytes Relative 6  3 - 12 %   Monocytes Absolute 0.6  0.1 - 1.0 K/uL   Eosinophils Relative 1  0 - 5 %   Eosinophils Absolute 0.1  0.0 - 0.7 K/uL   Basophils Relative 0  0 - 1 %   Basophils Absolute 0.0  0.0 - 0.1 K/uL  COMPREHENSIVE METABOLIC PANEL     Status: Abnormal   Collection Time    11/08/13  4:47 AM      Result Value Ref Range   Sodium 142  137 - 147 mEq/L   Potassium 3.8  3.7 - 5.3 mEq/L   Chloride 102  96 - 112 mEq/L   CO2 27  19 - 32 mEq/L    Glucose, Bld 99  70 - 99 mg/dL   BUN 13  6 - 23 mg/dL   Creatinine, Ser 7.82  0.50 - 1.10 mg/dL   Calcium 95.6  8.4 - 21.3 mg/dL   Total Protein 7.7  6.0 - 8.3 g/dL   Albumin 4.3  3.5 - 5.2 g/dL   AST 21  0 - 37 U/L   ALT 15  0 - 35 U/L   Alkaline Phosphatase 82  39 - 117 U/L   Total Bilirubin 0.3  0.3 - 1.2 mg/dL   GFR calc non Af Amer 69 (*) >90 mL/min   GFR calc Af Amer 80 (*) >90 mL/min  LIPASE, BLOOD     Status: None   Collection Time    11/08/13  4:47 AM      Result Value Ref Range   Lipase 38  11 - 59 U/L  URINALYSIS, ROUTINE W REFLEX MICROSCOPIC     Status: Abnormal   Collection Time    11/08/13  4:49 AM      Result Value Ref Range   Color, Urine YELLOW  YELLOW   APPearance CLEAR  CLEAR   Specific Gravity, Urine 1.009  1.005 - 1.030   pH 7.5  5.0 - 8.0   Glucose, UA NEGATIVE  NEGATIVE mg/dL   Hgb urine dipstick NEGATIVE  NEGATIVE   Bilirubin Urine NEGATIVE  NEGATIVE   Ketones, ur NEGATIVE  NEGATIVE mg/dL   Protein, ur NEGATIVE  NEGATIVE mg/dL   Urobilinogen, UA 0.2  0.0 - 1.0 mg/dL   Nitrite NEGATIVE  NEGATIVE   Leukocytes, UA TRACE (*) NEGATIVE  URINE MICROSCOPIC-ADD ON     Status: None   Collection Time    11/08/13  4:49 AM      Result Value Ref Range   Squamous Epithelial / LPF RARE  RARE   WBC, UA 0-2  <3 WBC/hpf   RBC / HPF 0-2  <3 RBC/hpf   Bacteria,  UA RARE  RARE  I-STAT CG4 LACTIC ACID, ED     Status: None   Collection Time    11/08/13  5:05 AM      Result Value Ref Range   Lactic Acid, Venous 1.11  0.5 - 2.2 mmol/L   I  MDM   DDX: gastroenteritis, colitis, ibs, ibd, uti  0500: notified that the patient had an episode of emesis in her bed.   [id number]: patient re-evaluated. Discomfort has improved. Still describes a vague burning sensation and a feeling "like my food isn't digesting right" in the lower abdomen. Patient says that she vomited earlier right after receiving morphine and that morphine always causes her to vomit.   [id number]: Patient  tolerating po intake. I have discussed the results of her labs. I do not feel that a CT a/p is indicated at this time. The patient's repeat abdominal exam is benign and her VS have remained wnl. Labs are all wnl -  Including CBC, lactic acid, lipase, BMP. Patient will call her transplant coordinator later in the morning to arrange f/u with transplant team. I have given her a copy of her lab results.   Brandt LoosenJulie Jade Burkard, MD 11/08/13 [phone]

## 2013-11-08 NOTE — ED Notes (Signed)
Patient currently waiting med time before discharge.  

## 2013-11-08 NOTE — ED Notes (Signed)
Patient actively vomiting at this time. Verbal order obtained from Dr. Lavella LemonsManly to administer 4 mg Zofran IV.

## 2013-11-08 NOTE — ED Notes (Signed)
Pt states she has been having ABD pain since yesterday, but can not say where her abd hurts.  Pt states some vomiting and diarrhea.

## 2013-11-08 NOTE — Discharge Instructions (Signed)
Abdominal Pain, Adult °Many things can cause abdominal pain. Usually, abdominal pain is not caused by a disease and will improve without treatment. It can often be observed and treated at home. Your health care provider will do a physical exam and possibly order blood tests and X-rays to help determine the seriousness of your pain. However, in many cases, more time must pass before a clear cause of the pain can be found. Before that point, your health care provider may not know if you need more testing or further treatment. °HOME CARE INSTRUCTIONS  °Monitor your abdominal pain for any changes. The following actions may help to alleviate any discomfort you are experiencing: °· Only take over-the-counter or prescription medicines as directed by your health care provider. °· Do not take laxatives unless directed to do so by your health care provider. °· Try a clear liquid diet (broth, tea, or water) as directed by your health care provider. Slowly move to a bland diet as tolerated. °SEEK MEDICAL CARE IF: °· You have unexplained abdominal pain. °· You have abdominal pain associated with nausea or diarrhea. °· You have pain when you urinate or have a bowel movement. °· You experience abdominal pain that wakes you in the night. °· You have abdominal pain that is worsened or improved by eating food. °· You have abdominal pain that is worsened with eating fatty foods. °SEEK IMMEDIATE MEDICAL CARE IF:  °· Your pain does not go away within 2 hours. °· You have a fever. °· You keep throwing up (vomiting). °· Your pain is felt only in portions of the abdomen, such as the right side or the left lower portion of the abdomen. °· You pass bloody or black tarry stools. °MAKE SURE YOU: °· Understand these instructions.   °· Will watch your condition.   °· Will get help right away if you are not doing well or get worse.   °Document Released: 06/11/2005 Document Revised: 06/22/2013 Document Reviewed: 05/11/2013 °[employer]® Patient  Information ©2014 [employer]. ° °

## 2013-12-17 ENCOUNTER — Encounter (HOSPITAL_COMMUNITY): Payer: Self-pay | Admitting: Emergency Medicine

## 2013-12-17 ENCOUNTER — Emergency Department (HOSPITAL_COMMUNITY)
Admission: EM | Admit: 2013-12-17 | Discharge: 2013-12-18 | Disposition: A | Discharge status: Home / Self Care | Payer: Medicare Other | Attending: Emergency Medicine | Admitting: Emergency Medicine

## 2013-12-17 DIAGNOSIS — I129 Hypertensive chronic kidney disease with stage 1 through stage 4 chronic kidney disease, or unspecified chronic kidney disease: Secondary | ICD-10-CM | POA: Insufficient documentation

## 2013-12-17 DIAGNOSIS — Z940 Kidney transplant status: Secondary | ICD-10-CM | POA: Insufficient documentation

## 2013-12-17 DIAGNOSIS — Z3202 Encounter for pregnancy test, result negative: Secondary | ICD-10-CM | POA: Insufficient documentation

## 2013-12-17 DIAGNOSIS — D72829 Elevated white blood cell count, unspecified: Secondary | ICD-10-CM | POA: Insufficient documentation

## 2013-12-17 DIAGNOSIS — Z79899 Other long term (current) drug therapy: Secondary | ICD-10-CM | POA: Insufficient documentation

## 2013-12-17 DIAGNOSIS — R112 Nausea with vomiting, unspecified: Secondary | ICD-10-CM | POA: Insufficient documentation

## 2013-12-17 DIAGNOSIS — Z7982 Long term (current) use of aspirin: Secondary | ICD-10-CM | POA: Insufficient documentation

## 2013-12-17 DIAGNOSIS — N189 Chronic kidney disease, unspecified: Secondary | ICD-10-CM | POA: Insufficient documentation

## 2013-12-17 DIAGNOSIS — IMO0002 Reserved for concepts with insufficient information to code with codable children: Secondary | ICD-10-CM | POA: Insufficient documentation

## 2013-12-17 DIAGNOSIS — N390 Urinary tract infection, site not specified: Secondary | ICD-10-CM | POA: Insufficient documentation

## 2013-12-17 LAB — URINALYSIS, ROUTINE W REFLEX MICROSCOPIC
BILIRUBIN URINE: NEGATIVE
Glucose, UA: NEGATIVE mg/dL
Hgb urine dipstick: NEGATIVE
KETONES UR: NEGATIVE mg/dL
Nitrite: NEGATIVE
Protein, ur: NEGATIVE mg/dL
SPECIFIC GRAVITY, URINE: 1.012 (ref 1.005–1.030)
UROBILINOGEN UA: 0.2 mg/dL (ref 0.0–1.0)
pH: 7 (ref 5.0–8.0)

## 2013-12-17 LAB — URINE MICROSCOPIC-ADD ON

## 2013-12-17 LAB — PREGNANCY, URINE: Preg Test, Ur: NEGATIVE

## 2013-12-17 MED ORDER — ONDANSETRON HCL 4 MG/2ML IJ SOLN
4.0000 mg | Freq: Once | INTRAMUSCULAR | Status: AC
  Administered 2013-12-18: 4 mg via INTRAVENOUS
  Filled 2013-12-17: qty 2

## 2013-12-17 MED ORDER — CEFTRIAXONE SODIUM 1 G IJ SOLR
1.0000 g | Freq: Once | INTRAMUSCULAR | Status: AC
  Administered 2013-12-18: 1 g via INTRAVENOUS
  Filled 2013-12-17: qty 10

## 2013-12-17 MED ORDER — SODIUM CHLORIDE 0.9 % IV BOLUS (SEPSIS)
1000.0000 mL | Freq: Once | INTRAVENOUS | Status: AC
  Administered 2013-12-18: 1000 mL via INTRAVENOUS

## 2013-12-17 NOTE — ED Notes (Signed)
Pt. reports dysuria with low back pain and bladder pain onset last night with chills.

## 2013-12-17 NOTE — ED Provider Notes (Signed)
CSN: [id number]     Arrival date & time 12/17/13  2016 History   First MD Initiated Contact with Patient 12/17/13 2346     Chief Complaint  Patient presents with  . Dysuria     (Consider location/radiation/quality/duration/timing/severity/associated sxs/prior Treatment) HPI Comments: The pt is a 53 y/o female s/p renal transplant 10 years ago who is currently immunosuprresed and complains of dysuria and lower abd pain that started yesterday.  She has had fever to 101 at home, one episode of vomiting and though she has been drinking fluids she has had nausea and chills.  Sx are persistent, nothing makes better or worse.  No recent abx.   Review of the EMR shows that the pt had normal renal function one month ago and as of yet has preserved renal function since transplant.  Patient is a 53 y.o. female presenting with dysuria. The history is provided by the patient.  Dysuria   Past Medical History  Diagnosis Date  . Chronic kidney disease     FSGS, kidney transplant  . Hypertension    Past Surgical History  Procedure Laterality Date  . Kidney transplant    . Tubal ligation     No family history on file. History  Substance Use Topics  . Smoking status: Never Smoker   . Smokeless tobacco: Not on file  . Alcohol Use: No   OB History   Grav Para Term Preterm Abortions TAB SAB Ect Mult Living   3 3        3      Review of Systems  Genitourinary: Positive for dysuria.  All other systems reviewed and are negative.      Allergies  Darvon and Morphine and related  Home Medications   Current Outpatient Rx  Name  Route  Sig  Dispense  Refill  . aspirin EC 81 MG tablet   Oral   Take 81 mg by mouth at bedtime.         Marland Kitchen atorvastatin (LIPITOR) 40 MG tablet   Oral   Take 20 mg by mouth at bedtime.         . Cholecalciferol 1000 UNITS capsule   Oral   Take 2,000 Units by mouth daily.         Marland Kitchen lisinopril (PRINIVIL,ZESTRIL) 20 MG tablet   Oral   Take 10 mg by mouth  every morning.         . magnesium oxide (MAG-OX) 400 MG tablet   Oral   Take 800 mg by mouth 3 (three) times daily.         . mycophenolate (CELLCEPT) 250 MG capsule   Oral   Take 500 mg by mouth 2 (two) times daily.         Marland Kitchen NIFEdipine (PROCARDIA-XL/ADALAT CC) 30 MG 24 hr tablet   Oral   Take 30 mg by mouth every 12 (twelve) hours.         . phosphorus (K-PHOS-NEUTRAL) [phone] MG tablet   Oral   Take 500 mg by mouth 3 (three) times daily.         . predniSONE (DELTASONE) 5 MG tablet   Oral   Take 5 mg by mouth daily with breakfast.         . tacrolimus (PROGRAF) 1 MG capsule   Oral   Take 2 mg by mouth 2 (two) times daily.         . cephALEXin (KEFLEX) 500 MG capsule   Oral  Take 1 capsule (500 mg total) by mouth 4 (four) times daily.   40 capsule   0   . ondansetron (ZOFRAN ODT) 4 MG disintegrating tablet   Oral   Take 1 tablet (4 mg total) by mouth every 8 (eight) hours as needed for nausea.   10 tablet   0   . traMADol (ULTRAM) 50 MG tablet   Oral   Take 1 tablet (50 mg total) by mouth every 6 (six) hours as needed.   15 tablet   0    BP 96/57  Pulse 73  Temp(Src) 99.6 F (37.6 C) (Oral)  Resp 18  SpO2 97% Physical Exam  Nursing note and vitals reviewed. Constitutional: She appears well-developed and well-nourished. No distress.  HENT:  Head: Normocephalic and atraumatic.  Mouth/Throat: Oropharynx is clear and moist. No oropharyngeal exudate.  Eyes: Conjunctivae and EOM are normal. Pupils are equal, round, and reactive to light. Right eye exhibits no discharge. Left eye exhibits no discharge. No scleral icterus.  Neck: Normal range of motion. Neck supple. No JVD present. No thyromegaly present.  Cardiovascular: Regular rhythm, normal heart sounds and intact distal pulses.  Exam reveals no gallop and no friction rub.   No murmur heard. Pulse of 105 on my exam, normal CRT, no JVD   Pulmonary/Chest: Effort normal and breath sounds  normal. No respiratory distress. She has no wheezes. She has no rales.  Abdominal: Soft. Bowel sounds are normal. She exhibits no distension and no mass. There is tenderness ( mild LLQ and SP ttp, no guarding, no peritoneal signs).  No CVA ttp  Musculoskeletal: Normal range of motion. She exhibits no edema and no tenderness.  Lymphadenopathy:    She has no cervical adenopathy.  Neurological: She is alert. Coordination normal.  Skin: Skin is warm and dry. No rash noted. No erythema.  Psychiatric: She has a normal mood and affect. Her behavior is normal.    ED Course  Procedures (including critical care time) Labs Review Labs Reviewed  URINALYSIS, ROUTINE W REFLEX MICROSCOPIC - Abnormal; Notable for the following:    APPearance CLOUDY (*)    Leukocytes, UA MODERATE (*)    All other components within normal limits  URINE MICROSCOPIC-ADD ON - Abnormal; Notable for the following:    Squamous Epithelial / LPF FEW (*)    Bacteria, UA MANY (*)    All other components within normal limits  CBC WITH DIFFERENTIAL - Abnormal; Notable for the following:    WBC 14.9 (*)    Neutrophils Relative % 94 (*)    Neutro Abs 14.0 (*)    Lymphocytes Relative 4 (*)    Lymphs Abs 0.6 (*)    Monocytes Relative 2 (*)    All other components within normal limits  BASIC METABOLIC PANEL - Abnormal; Notable for the following:    Potassium 3.5 (*)    Glucose, Bld 102 (*)    GFR calc non Af Amer 62 (*)    GFR calc Af Amer 72 (*)    All other components within normal limits  URINE CULTURE  PREGNANCY, URINE  I-STAT CG4 LACTIC ACID, ED   Imaging Review No results found.    MDM   Final diagnoses:  UTI (lower urinary tract infection)    Pt has renal transplant - increased sx of UTI, high risk patient - will add labs to evalute renal function - fluid and IV Antibiotics and antiemetics.  No hypotension here.  BMP with normal preserved renal function,  CBC with some leukocytosis.  No fever, no tachycardia  (pulse 80 on d/c) and no respiratory sx.  No other SIRS criteria and after IVF and meds she feels better, amenable to d/c and f/u.  Meds given in ED:  Medications  sodium chloride 0.9 % bolus 1,000 mL (1,000 mLs Intravenous New Bag/Given 12/18/13 0028)  cefTRIAXone (ROCEPHIN) 1 g in dextrose 5 % 50 mL IVPB (0 g Intravenous Stopped 12/18/13 0100)  ondansetron (ZOFRAN) injection 4 mg (4 mg Intravenous Given 12/18/13 0016)    New Prescriptions   CEPHALEXIN (KEFLEX) 500 MG CAPSULE    Take 1 capsule (500 mg total) by mouth 4 (four) times daily.   ONDANSETRON (ZOFRAN ODT) 4 MG DISINTEGRATING TABLET    Take 1 tablet (4 mg total) by mouth every 8 (eight) hours as needed for nausea.   TRAMADOL (ULTRAM) 50 MG TABLET    Take 1 tablet (50 mg total) by mouth every 6 (six) hours as needed.      Vida Roller, MD 12/18/13 [phone]

## 2013-12-18 LAB — BASIC METABOLIC PANEL
BUN: 12 mg/dL (ref 6–23)
CHLORIDE: 102 meq/L (ref 96–112)
CO2: 23 meq/L (ref 19–32)
Calcium: 9.4 mg/dL (ref 8.4–10.5)
Creatinine, Ser: 1.03 mg/dL (ref 0.50–1.10)
GFR calc Af Amer: 72 mL/min — ABNORMAL LOW (ref >90)
GFR calc non Af Amer: 62 mL/min — ABNORMAL LOW (ref >90)
Glucose, Bld: 102 mg/dL — ABNORMAL HIGH (ref 70–99)
Potassium: 3.5 mEq/L — ABNORMAL LOW (ref 3.7–5.3)
Sodium: 140 mEq/L (ref 137–147)

## 2013-12-18 LAB — CBC WITH DIFFERENTIAL/PLATELET
Basophils Absolute: 0 10*3/uL (ref 0.0–0.1)
Basophils Relative: 0 % (ref 0–1)
Eosinophils Absolute: 0.1 10*3/uL (ref 0.0–0.7)
Eosinophils Relative: 0 % (ref 0–5)
HEMATOCRIT: 39 % (ref 36.0–46.0)
HEMOGLOBIN: 13 g/dL (ref 12.0–15.0)
LYMPHS PCT: 4 % — AB (ref 12–46)
Lymphs Abs: 0.6 10*3/uL — ABNORMAL LOW (ref 0.7–4.0)
MCH: 31 pg (ref 26.0–34.0)
MCHC: 33.3 g/dL (ref 30.0–36.0)
MCV: 93.1 fL (ref 78.0–100.0)
MONOS PCT: 2 % — AB (ref 3–12)
Monocytes Absolute: 0.3 10*3/uL (ref 0.1–1.0)
NEUTROS ABS: 14 10*3/uL — AB (ref 1.7–7.7)
Neutrophils Relative %: 94 % — ABNORMAL HIGH (ref 43–77)
Platelets: ADEQUATE 10*3/uL (ref 150–400)
RBC: 4.19 MIL/uL (ref 3.87–5.11)
RDW: 13.7 % (ref 11.5–15.5)
WBC: 14.9 10*3/uL — AB (ref 4.0–10.5)

## 2013-12-18 LAB — I-STAT CG4 LACTIC ACID, ED: LACTIC ACID, VENOUS: 0.92 mmol/L (ref 0.5–2.2)

## 2013-12-18 MED ORDER — ONDANSETRON 4 MG PO TBDP: 4.0000 mg | ORAL_TABLET | Freq: Three times a day (TID) | ORAL | Status: AC | PRN

## 2013-12-18 MED ORDER — CEPHALEXIN 500 MG PO CAPS: 500.0000 mg | ORAL_CAPSULE | Freq: Four times a day (QID) | ORAL | Status: AC

## 2013-12-18 MED ORDER — TRAMADOL HCL 50 MG PO TABS: 50.0000 mg | ORAL_TABLET | Freq: Four times a day (QID) | ORAL | Status: AC | PRN

## 2013-12-18 NOTE — Discharge Instructions (Signed)
Please call your doctor for a followup appointment within 24-48 hours. When you talk to your doctor please let them know that you were seen in the emergency department and have them acquire all of your records so that they can discuss the findings with you and formulate a treatment plan to fully care for your new and ongoing problems. ° ° °Emergency Department Resource Guide °1) Find a Doctor and Pay Out of Pocket °Although you won't have to find out who is covered by your insurance plan, it is a good idea to ask around and get recommendations. You will then need to call the office and see if the doctor you have chosen will accept you as a new patient and what types of options they offer for patients who are self-pay. Some doctors offer discounts or will set up payment plans for their patients who do not have insurance, but you will need to ask so you aren't surprised when you get to your appointment. ° °2) Contact Your Local Health Department °Not all health departments have doctors that can see patients for sick visits, but many do, so it is worth a call to see if yours does. If you don't know where your local health department is, you can check in your phone book. The CDC also has a tool to help you locate your state's health department, and many state websites also have listings of all of their local health departments. ° °3) Find a Walk-in Clinic °If your illness is not likely to be very severe or complicated, you may want to try a walk in clinic. These are popping up all over the country in pharmacies, drugstores, and shopping centers. They're usually staffed by nurse practitioners or physician assistants that have been trained to treat common illnesses and complaints. They're usually fairly quick and inexpensive. However, if you have serious medical issues or chronic medical problems, these are probably not your best option. ° °No Primary Care Doctor: °- Call [facility] at  832-8000 - they can help you  locate a primary care doctor that  accepts your insurance, provides certain services, etc. °- Physician Referral Service- 1-800-533-3463 ° °Chronic Pain Problems: °Organization         Address  Phone   Notes  °[facility]  (336) 297-2271 Patients need to be referred by their primary care doctor.  ° °Medication Assistance: °Organization         Address  Phone   Notes  °[place] [facility] [address] °[place], [place] [address] (336) 641-8030 --Must be a resident of [place] °-- Must have NO insurance coverage whatsoever (no Medicaid/ Medicare, etc.) °-- The pt. MUST have a primary care doctor that directs their care regularly and follows them in the community °  °[facility]  (866) 331-1348   °[employer]  (888) 892-1162   ° °Agencies that provide inexpensive medical care: °Organization         Address  Phone   Notes  °[facility]  (336) 832-8035   °[facility]    (336) 832-7272   °[facility] [address] °[place], [place] [address] (336) 832-4777   °[facility] [address], °[place] (336) 271-4999   °[facility]    (336) 373-0678   °[facility]    (336) 272-1050   °[facility] ° [address], [place] Phone:  (336)   832-4444, Fax:  (336) 832-4440 Hours of Operation:  9 am - 6 pm, M-F.  Also accepts Medicaid/Medicare and self-pay.  °[facility] ° [address], [place] Phone: (336) 832-3150, Fax: (336) 832-3151. Hours of Operation:  8:30 am - 5:30 pm, M-F.  Also accepts Medicaid and self-pay.  °[facility] [place] [address], [place] Phone: (336) 878-6027   °[facility] [address], [place], [place] (336)723-1848, Ext. 123 Mondays & Thursdays: 7-9 AM.  First 15 patients are seen on a first come, first serve basis. °  ° °Medicaid-accepting [place]  Providers: ° °Organization         Address  Phone   Notes  °[facility] [address], [place] (336) 641-2100 Also accepts self-pay patients.  °[facility] [address], [place] ° (336) 856-9996   °[facility] [address], [place] (336) 288-8857   °[facility] [address] [place] [address], [place] (336) 299-7000   °Veita Bland [address], [place]  ° (336) 373-1557 Only accepts [place] Access Medicaid patients after they have their name applied to their card.  ° °Self-Pay (no insurance) in [place]: ° °Organization         Address  Phone   Notes  °Sickle Cell Patients, [facility] [address], [place] (336) 832-1970   °[facility] [address], [place] (336) 832-4400   °[facility] [place] ° [address], [place] (336) 992-4800   °[facility]/Dr. Osei-Bonsu ° [address] [place] [address], [place] or [address], [place] (336) 841-8500 Phone number for both [place] and [place] locations is the same.  °[facility] [address], [place] (336) 299-0000   °[facility] [address] [place] [address], [place] or [address] (336) 852-7530 °(336) 878-2260   °[facility] [address], [place] (336) 350-1642, phone; (336) 294-5005, fax Sees patients 1st and 3rd Saturday of every month.  Must not qualify for public or private insurance (i.e. Medicaid, Medicare, [place] Health Choice, Veterans' Benefits) • Household income should be no more than 200% of the poverty level •The clinic cannot treat you if you are pregnant or think you are pregnant • Sexually transmitted diseases are not treated at the clinic.  ° ° °Dental Care: °Organization         Address  Phone  Notes  °[facility] [address], [place] (336) 641-6152 Accepts children up to age 21 who are enrolled in Medicaid or [place] Health Choice; pregnant women with a Medicaid card; and children who have applied for Medicaid or [place] Health Choice, but were declined, whose parents can pay a reduced fee at time of service.  °[facility] [place]  [address], [place] (336) 641-7733 Accepts children up to age 21 who are enrolled in Medicaid or [place] Health Choice; pregnant women with a Medicaid card; and children who have applied for Medicaid or [place] Health Choice, but were declined, whose parents can pay a reduced fee at time of service.  °[facility] ° [address], [place] (336) 641-4533 Patients are seen by appointment only. Walk-ins are   not accepted. [facility] will see patients 18 years of age and older. °Monday - Tuesday (8am-5pm) °Most Wednesdays (8:30-5pm) °$30 per visit, cash only  °[facility] ° [address], [place] (336) 641-4533 Patients are seen by appointment only. Walk-ins are not accepted. [facility] will see patients 18 years of age and older. °One Wednesday Evening (Monthly: Volunteer Based).  $30 per visit, cash only  °[facility]  (919) 537-3737 for adults; Children under age 4, call Graduate Pediatric Dentistry at ([phone]) [phone]. Children aged 4-14, please call ([phone]) [phone] to request a pediatric application. ° Dental services are provided in all areas of dental care including fillings, crowns and bridges, complete and partial dentures, implants, gum treatment, root canals, and extractions. Preventive care is also provided. Treatment is provided to both adults and children. °Patients are selected via a lottery and there is often a waiting list. °  °[facility] [address], °[place] ° (336) 763-8833 [email or web address] °  °[facility]  [address], [place], [place] (336)723-1848, Ext. 123 Second and Fourth Thursday of each month, opens at 6:30 AM; Clinic ends at 9 AM.  Patients are seen on a first-come first-served basis, and a limited number are seen during each clinic.  ° °[facility] ° [address], [place], [place] (336) 723-7904   Eligibility Requirements °You must have lived in [place], [place], or [place] counties for at least the last three months. °  You cannot be eligible for state or federal sponsored healthcare insurance, including Veterans Administration, Medicaid, or Medicare. °  You generally cannot be eligible for healthcare insurance through your employer.  °  How to apply: °Eligibility screenings are held every Tuesday and Wednesday afternoon from 1:00 pm until 4:00 pm. You do not need an appointment for the interview!  °[facility] [address], [place], [place] 336-631-2330   °[facility]  336-342-8273   °[facility]  336-703-3100   °[facility]  336-570-6415   ° °Behavioral Health Resources in the Community: °Intensive Outpatient Programs °Organization         Address  Phone  Notes  °[facility] [address], [place], [place] 336-878-6098   °[facility] [address], [place], [place] 336-832-9800   °[facility] [address], [place], [place] ° 336-882-2125   °[facility] [address],  °[place], [place] 1-800-853-5163 or 336-641-4981   °Substance Abuse Resources °Organization         Address  Phone  Notes  °[facility]  336-882-2125   °[facility]  336-784-9470   °[facility]  336-285-9073   °[facility]  336-845-3988   °Residential & Outpatient Substance Abuse Program  1-800-659-3381   °Psychological Services °Organization         Address  Phone  Notes  °[facility]  336- 832-9600     °[facility]  336- 378-7881   °[facility] [address], [place] 1-800-853-5163 or 336-641-4981   ° °Mobile Crisis Teams °Organization         Address  Phone  Notes  °[facility], Mobile Crisis Care Unit  1-877-626-1772   °Assertive °[facility] ° [address] [place], [place] 336-834-9664   °Sharon DeEsch [address] °[place]  336-554-5454   ° °  Self-Help/Support Groups °Organization         Address  Phone             Notes  °[facility] - variety of support groups  336- 373-1402 Call for more information  °Narcotics Anonymous (NA), [facility] [address], °[place]  2 meetings at this location  ° °Residential Treatment Programs °Organization         Address  Phone  Notes  °[facility] [address],    °[place]  1-866-801-8205   °[facility] ° [address], [place], [place] 704-293-8524   °[facility] [address], [place] 336-845-3988 Admissions: 8am-3pm M-F  °[facility] [address]    °[place], [place] 336-841-1104   °[facility] [address], [place], [place] 336-379-7146   °[facility] [address]  °[place], [place] 336-285-9073   °[facility] - Intensive Outpatient [address], [place], [place] 336-852-3033   °[facility] [address]  °[place], [place] 1-877-615-2722 or 336-784-9470   °[facility] [address] [place], [place] 336-227-7417 Accepts Medicaid  °[facility] [address]  °[place] 1-800-659-3381 Substance Abuse/Addiction Treatment  ° °[place] [facility] Resources °Organization         Address  Phone  Notes  °[facility]  (888) 581-9988   °Julie Brannon, PhD [address] [place], [place]   (336) 349-5553 or (336) 951-0000    °[facility]   [address] °[place], [place] (336) 349-4454   °[facility] [address], [place], [place] (336) 342-8316 Insurance/Medicaid/sponsorship through [facility]  °[facility] [address]                                    [place], [place] (336) 342-8316 Therapy/tele-psych/case  °[facility] [address]  ° [place], [place] (336) 349-2233    °Dr. Arfeen  (336) 349-4544   °[facility] [place]  [employer] [place] [facility] 1) [address],  °2) [address], [place] °3)  [address], [place] (336) 349-3220 °(336) 342-7768 ° °(336) 342-8140   °[place] [facility] (336) 342-1394 or (336) 342-3537 (After Hours)    ° ° ° °

## 2013-12-18 NOTE — ED Notes (Signed)
Pt A&OX4, ambulatory at discharge, verbalizing no complaints at this time. 

## 2013-12-20 LAB — URINE CULTURE: Colony Count: >=100000

## 2013-12-21 ENCOUNTER — Telehealth (HOSPITAL_BASED_OUTPATIENT_CLINIC_OR_DEPARTMENT_OTHER): Payer: Self-pay | Admitting: Emergency Medicine

## 2013-12-21 NOTE — Telephone Encounter (Signed)
Post ED Visit - Positive Culture Follow-up  Culture report reviewed by antimicrobial stewardship pharmacist: []  Wes Dulaney, Pharm.D., BCPS []  Celedonio MiyamotoJeremy Frens, Pharm.D., BCPS []  Georgina PillionElizabeth Martin, Pharm.D., BCPS []  WesterveltMinh Pham, [address]Ph[address]D., BCPS, AAHIVP []  Estella HuskMichelle Turner, Pharm.D., BCPS, AAHIVP [x]  Harvie JuniorNathan Cope, Pharm.D.  Positive urine culture Treated with Keflex, organism sensitive to the same and no further patient follow-up is required at this time.  Shabana Armentrout 12/21/2013, 2:09 PM

## 2014-07-17 ENCOUNTER — Encounter (HOSPITAL_COMMUNITY): Payer: Self-pay | Admitting: Emergency Medicine
